# Patient Record
Sex: Female | Born: 1965 | Race: White | Hispanic: No | Marital: Single | Smoking: Current every day smoker
Health system: Southern US, Community
[De-identification: ages and names within clinical notes are randomized; demographics above are authoritative.]

## PROBLEM LIST (undated history)

## (undated) DIAGNOSIS — F419 Anxiety disorder, unspecified: Secondary | ICD-10-CM

## (undated) HISTORY — PX: OTHER SURGICAL HISTORY: SHX169

---

## 2014-06-27 ENCOUNTER — Encounter (HOSPITAL_BASED_OUTPATIENT_CLINIC_OR_DEPARTMENT_OTHER): Payer: Self-pay | Admitting: Emergency Medicine

## 2014-06-27 ENCOUNTER — Emergency Department (HOSPITAL_BASED_OUTPATIENT_CLINIC_OR_DEPARTMENT_OTHER)
Admission: EM | Admit: 2014-06-27 | Discharge: 2014-06-27 | Disposition: A | Discharge status: Home / Self Care | Payer: BLUE CROSS/BLUE SHIELD | Attending: Emergency Medicine | Admitting: Emergency Medicine

## 2014-06-27 ENCOUNTER — Emergency Department (HOSPITAL_BASED_OUTPATIENT_CLINIC_OR_DEPARTMENT_OTHER): Payer: BLUE CROSS/BLUE SHIELD

## 2014-06-27 DIAGNOSIS — Z720 Tobacco use: Secondary | ICD-10-CM | POA: Insufficient documentation

## 2014-06-27 DIAGNOSIS — R079 Chest pain, unspecified: Secondary | ICD-10-CM | POA: Diagnosis not present

## 2014-06-27 DIAGNOSIS — R51 Headache: Secondary | ICD-10-CM | POA: Diagnosis not present

## 2014-06-27 LAB — BASIC METABOLIC PANEL
Anion gap: 4 — ABNORMAL LOW (ref 5–15)
BUN: 14 mg/dL (ref 6–23)
CHLORIDE: 106 meq/L (ref 96–112)
CO2: 27 mmol/L (ref 19–32)
Calcium: 8.8 mg/dL (ref 8.4–10.5)
Creatinine, Ser: 0.82 mg/dL (ref 0.50–1.10)
GFR, EST NON AFRICAN AMERICAN: 83 mL/min — AB (ref >90)
Glucose, Bld: 100 mg/dL — ABNORMAL HIGH (ref 70–99)
POTASSIUM: 3.5 mmol/L (ref 3.5–5.1)
SODIUM: 137 mmol/L (ref 135–145)

## 2014-06-27 LAB — CBC WITH DIFFERENTIAL/PLATELET
BASOS ABS: 0.1 10*3/uL (ref 0.0–0.1)
Basophils Relative: 1 % (ref 0–1)
Eosinophils Absolute: 0.2 10*3/uL (ref 0.0–0.7)
Eosinophils Relative: 2 % (ref 0–5)
HCT: 38.5 % (ref 36.0–46.0)
HEMOGLOBIN: 13.1 g/dL (ref 12.0–15.0)
LYMPHS PCT: 28 % (ref 12–46)
Lymphs Abs: 3 10*3/uL (ref 0.7–4.0)
MCH: 31.3 pg (ref 26.0–34.0)
MCHC: 34 g/dL (ref 30.0–36.0)
MCV: 92.1 fL (ref 78.0–100.0)
MONOS PCT: 6 % (ref 3–12)
Monocytes Absolute: 0.6 10*3/uL (ref 0.1–1.0)
Neutro Abs: 6.9 10*3/uL (ref 1.7–7.7)
Neutrophils Relative %: 63 % (ref 43–77)
Platelets: 227 10*3/uL (ref 150–400)
RBC: 4.18 MIL/uL (ref 3.87–5.11)
RDW: 13.2 % (ref 11.5–15.5)
WBC: 10.7 10*3/uL — ABNORMAL HIGH (ref 4.0–10.5)

## 2014-06-27 LAB — TROPONIN I: Troponin I: <0.03 ng/mL (ref <0.031)

## 2014-06-27 NOTE — ED Provider Notes (Signed)
CSN: [id number]     Arrival date & time 06/27/14  1424 History   This chart was scribed for Vanetta MuldersScott Brenlynn Fake, MD by Evon Slackerrance Branch, ED Scribe. This patient was seen in room MH11/MH11 and the patient's care was started at 3:08 PM.      Chief Complaint  Patient presents with  . Chest Pain   Patient is a 49 y.o. female presenting with chest pain. The history is provided by the patient. No language interpreter was used.  Chest Pain Pain location:  L chest Pain quality: aching, dull and sharp   Pain radiates to:  Does not radiate Pain radiates to the back: no   Pain severity:  Mild Onset quality:  Sudden Duration:  1 day Timing:  Intermittent Chronicity:  New Context: at rest   Relieved by:  None tried Worsened by:  Nothing tried Ineffective treatments:  None tried Associated symptoms: headache   Associated symptoms: no abdominal pain, no back pain, no cough, no fever, no nausea, no shortness of breath and not vomiting    HPI Comments: Alexa Delacruz is a 49 y.o. female who presents to the Emergency Department complaining of sharp dull aching intermittent left sided chest pain near her left breast onset 1 day prior. Pt states each episode last for about 1 minute at a time. Pt states that the pain is non radiating. Denies SOB, nausea or vomiting. Denies HX of cardiac disease. Pt states her father has a HX of cardiac disease. Pt does also report having HA daily.      PCP Dr. Wylene SimmerPollard   History reviewed. No pertinent past medical history. History reviewed. No pertinent past surgical history. No family history on file. History  Substance Use Topics  . Smoking status: Current Every Day Smoker  . Smokeless tobacco: Not on file  . Alcohol Use: Not on file   OB History    No data available     Review of Systems  Constitutional: Negative for fever and chills.  HENT: Negative for rhinorrhea and sore throat.   Eyes: Negative for visual disturbance.  Respiratory: Negative for cough  and shortness of breath.   Cardiovascular: Positive for chest pain. Negative for leg swelling.  Gastrointestinal: Negative for nausea, vomiting, abdominal pain and diarrhea.  Genitourinary: Negative for dysuria.  Musculoskeletal: Negative for back pain and neck pain.  Skin: Negative for rash.  Neurological: Positive for headaches.  Psychiatric/Behavioral: Negative for confusion.  All other systems reviewed and are negative.    Allergies  Percocet  Home Medications   Prior to Admission medications   Not on File   BP 105/41 mmHg  Pulse 62  Temp(Src) 98.6 F (37 C) (Oral)  Resp 16  SpO2 99%  LMP 11/25/2013   Physical Exam  Constitutional: She is oriented to person, place, and time. She appears well-developed and well-nourished. No distress.  HENT:  Head: Normocephalic and atraumatic.  Eyes: Conjunctivae and EOM are normal. Pupils are equal, round, and reactive to light.  Neck: Neck supple. No tracheal deviation present.  Cardiovascular: Normal rate and regular rhythm.   Pulmonary/Chest: Effort normal and breath sounds normal. No respiratory distress. She has no wheezes. She has no rales.  Abdominal: Soft. Bowel sounds are normal. There is no tenderness.  Musculoskeletal: Normal range of motion. She exhibits no edema.  Neurological: She is alert and oriented to person, place, and time. No cranial nerve deficit.  Skin: Skin is warm and dry.  Psychiatric: She has a normal mood and affect.  Her behavior is normal.  Nursing note and vitals reviewed.   ED Course  Procedures (including critical care time) DIAGNOSTIC STUDIES: Oxygen Saturation is 98% on RA, normal by my interpretation.    COORDINATION OF CARE: 3:31 PM-Discussed treatment plan with pt at bedside and pt agreed to plan.     Labs Review Labs Reviewed  CBC WITH DIFFERENTIAL - Abnormal; Notable for the following:    WBC 10.7 (*)    All other components within normal limits  BASIC METABOLIC PANEL - Abnormal;  Notable for the following:    Glucose, Bld 100 (*)    GFR calc non Af Amer 83 (*)    Anion gap 4 (*)    All other components within normal limits  TROPONIN I   Results for orders placed or performed during the hospital encounter of 06/27/14  CBC with Differential  Result Value Ref Range   WBC 10.7 (H) 4.0 - 10.5 K/uL   RBC 4.18 3.87 - 5.11 MIL/uL   Hemoglobin 13.1 12.0 - 15.0 g/dL   HCT 16.1 09.6 - 04.5 %   MCV 92.1 78.0 - 100.0 fL   MCH 31.3 26.0 - 34.0 pg   MCHC 34.0 30.0 - 36.0 g/dL   RDW 40.9 81.1 - 91.4 %   Platelets 227 150 - 400 K/uL   Neutrophils Relative % 63 43 - 77 %   Neutro Abs 6.9 1.7 - 7.7 K/uL   Lymphocytes Relative 28 12 - 46 %   Lymphs Abs 3.0 0.7 - 4.0 K/uL   Monocytes Relative 6 3 - 12 %   Monocytes Absolute 0.6 0.1 - 1.0 K/uL   Eosinophils Relative 2 0 - 5 %   Eosinophils Absolute 0.2 0.0 - 0.7 K/uL   Basophils Relative 1 0 - 1 %   Basophils Absolute 0.1 0.0 - 0.1 K/uL  Basic metabolic panel  Result Value Ref Range   Sodium 137 135 - 145 mmol/L   Potassium 3.5 3.5 - 5.1 mmol/L   Chloride 106 96 - 112 mEq/L   CO2 27 19 - 32 mmol/L   Glucose, Bld 100 (H) 70 - 99 mg/dL   BUN 14 6 - 23 mg/dL   Creatinine, Ser 7.82 0.50 - 1.10 mg/dL   Calcium 8.8 8.4 - 95.6 mg/dL   GFR calc non Af Amer 83 (L) >90 mL/min   GFR calc Af Amer >90 >90 mL/min   Anion gap 4 (L) 5 - 15  Troponin I  Result Value Ref Range   Troponin I <0.03 <0.031 ng/mL     Imaging Review Dg Chest 2 View  06/27/2014   CLINICAL DATA:  Left chest pain since yesterday.  Smoker with cough.  EXAM: CHEST  2 VIEW  COMPARISON:  None.  FINDINGS: Heart and mediastinal contours are within normal limits. No focal opacities or effusions. No acute bony abnormality.  IMPRESSION: No active cardiopulmonary disease.   Electronically Signed   By: Charlett Nose M.D.   On: 06/27/2014 15:05     EKG Interpretation   Date/Time:  Saturday June 27 2014 14:28:40 EST Ventricular Rate:  84 PR Interval:  142 QRS  Duration: 100 QT Interval:  382 QTC Calculation: 451 R Axis:   78 Text Interpretation:  Normal sinus rhythm with sinus arrhythmia Right  atrial enlargement ST \\T \ T wave abnormality, consider inferior ischemia  Abnormal ECG No old tracing to compare Confirmed by WARD,  DO, KRISTEN  ([id number]) on 06/27/2014 2:32:21 PM Also confirmed by Deretha Emory  MD, Lorin Picket  [phone])  on 06/27/2014 3:09:49 PM      MDM   Final diagnoses:  Chest pain, unspecified chest pain type   Workup for the chest pain without any acute findings. EKG without acute cardiac changes troponin was negative. Chest x-ray negative for pneumonia pneumothorax or pulmonary edema. Patient's chest pain has been intermittent is not been lasting for a long period of time. Never had chest pain for 15 or 20 minutes.   I personally performed the services described in this documentation, which was scribed in my presence. The recorded information has been reviewed and is accurate.       Vanetta Mulders, MD 06/28/14 [phone]

## 2014-06-27 NOTE — Discharge Instructions (Signed)
Workup for the chest pain was negative. Return for any new or worse symptoms or chest pain lasting 15 or 20 minutes or longer. Would recommend follow-up with your regular doctor in the next few days for EKG reviewed.

## 2014-06-27 NOTE — ED Notes (Signed)
D/c home with ride- no new rx given 

## 2014-06-27 NOTE — ED Notes (Signed)
MD at bedside. 

## 2014-06-27 NOTE — ED Notes (Signed)
PT presents  To ED with complaints of pain under her left breast intermittent since yesterday.

## 2015-03-19 ENCOUNTER — Encounter (HOSPITAL_BASED_OUTPATIENT_CLINIC_OR_DEPARTMENT_OTHER): Payer: Self-pay

## 2015-03-19 ENCOUNTER — Emergency Department (HOSPITAL_BASED_OUTPATIENT_CLINIC_OR_DEPARTMENT_OTHER)
Admission: EM | Admit: 2015-03-19 | Discharge: 2015-03-19 | Disposition: A | Discharge status: Home / Self Care | Payer: BLUE CROSS/BLUE SHIELD | Attending: Emergency Medicine | Admitting: Emergency Medicine

## 2015-03-19 DIAGNOSIS — K029 Dental caries, unspecified: Secondary | ICD-10-CM | POA: Insufficient documentation

## 2015-03-19 DIAGNOSIS — K0889 Other specified disorders of teeth and supporting structures: Secondary | ICD-10-CM | POA: Insufficient documentation

## 2015-03-19 DIAGNOSIS — Z720 Tobacco use: Secondary | ICD-10-CM | POA: Diagnosis not present

## 2015-03-19 MED ORDER — HYDROCODONE-ACETAMINOPHEN 5-325 MG PO TABS: 1.0000 | ORAL_TABLET | Freq: Four times a day (QID) | ORAL | Status: AC | PRN

## 2015-03-19 MED ORDER — PENICILLIN V POTASSIUM 500 MG PO TABS: 500.0000 mg | ORAL_TABLET | Freq: Three times a day (TID) | ORAL | Status: AC

## 2015-03-19 NOTE — Discharge Instructions (Signed)
Penicillin as prescribed.  Hydrocodone as prescribed as needed for pain.  Follow-up with your dentist on Monday, and return to the ER if symptoms significantly worsen or change.

## 2015-03-19 NOTE — ED Provider Notes (Signed)
CSN: [id number]     Arrival date & time 03/19/15  8295 History   First MD Initiated Contact with Patient 03/19/15 [phone]     Chief Complaint  Patient presents with  . Dental Pain     (Consider location/radiation/quality/duration/timing/severity/associated sxs/prior Treatment) Patient is a 49 y.o. female presenting with tooth pain. The history is provided by the patient.  Dental Pain Location:  Lower Lower teeth location:  18/LL 2nd molar Quality:  Throbbing Severity:  Severe Onset quality:  Sudden Duration:  2 days Timing:  Constant Progression:  Worsening Chronicity:  New Relieved by:  Nothing Worsened by:  Nothing tried Ineffective treatments:  None tried   History reviewed. No pertinent past medical history. Past Surgical History  Procedure Laterality Date  . Cesearean     History reviewed. No pertinent family history. Social History  Substance Use Topics  . Smoking status: Current Every Day Smoker  . Smokeless tobacco: None  . Alcohol Use: No   OB History    No data available     Review of Systems  All other systems reviewed and are negative.     Allergies  Percocet  Home Medications   Prior to Admission medications   Medication Sig Start Date End Date Taking? Authorizing Provider  ESTROGENS CONJ SYNTHETIC A PO Take by mouth.   Yes Historical Provider, MD   BP 132/76 mmHg  Pulse 70  Temp(Src) 98.5 F (36.9 C) (Oral)  Resp 16  Ht  (1.778 m)  Wt 147 lb (66.679 kg)  BMI 21.09 kg/m2  SpO2 99%  LMP 11/25/2013 Physical Exam  Constitutional: She appears well-developed and well-nourished. No distress.  HENT:  Head: Normocephalic and atraumatic.  Mouth/Throat: Oropharynx is clear and moist.  There are several decayed teeth present. The left lower molars are heavily decayed with fillings in place. There is tenderness to the left lower jaw, however no significant swelling, adenopathy, or crepitus.  Neck: Normal range of motion. Neck supple.   Pulmonary/Chest: No stridor.  Lymphadenopathy:    She has no cervical adenopathy.  Skin: Skin is warm and dry. She is not diaphoretic.  Nursing note and vitals reviewed.   ED Course  Procedures (including critical care time) Labs Review Labs Reviewed - No data to display  Imaging Review No results found. I have personally reviewed and evaluated these images and lab results as part of my medical decision-making.   EKG Interpretation None      MDM   Final diagnoses:  None    We'll treat with antibiotics, pain meds, and follow-up with dentistry.    Geoffery Lyons, MD 03/19/15 [phone]

## 2015-03-19 NOTE — ED Notes (Signed)
Pt reports left lower dental pain x2 days - reports caries, broken teeth. States she has a Education officer, community but they can not see her until next week. Pt reports Naproxen (2) this morning but reports minimal relief. Pain 9/10 at this time.

## 2015-03-20 ENCOUNTER — Emergency Department (HOSPITAL_BASED_OUTPATIENT_CLINIC_OR_DEPARTMENT_OTHER)
Admission: EM | Admit: 2015-03-20 | Discharge: 2015-03-20 | Disposition: A | Discharge status: Home / Self Care | Payer: BLUE CROSS/BLUE SHIELD | Attending: Emergency Medicine | Admitting: Emergency Medicine

## 2015-03-20 ENCOUNTER — Encounter (HOSPITAL_BASED_OUTPATIENT_CLINIC_OR_DEPARTMENT_OTHER): Payer: Self-pay | Admitting: Emergency Medicine

## 2015-03-20 DIAGNOSIS — R112 Nausea with vomiting, unspecified: Secondary | ICD-10-CM | POA: Diagnosis present

## 2015-03-20 DIAGNOSIS — Z720 Tobacco use: Secondary | ICD-10-CM | POA: Insufficient documentation

## 2015-03-20 MED ORDER — METOCLOPRAMIDE HCL 10 MG PO TABS: 10.0000 mg | ORAL_TABLET | Freq: Four times a day (QID) | ORAL | Status: AC | PRN

## 2015-03-20 MED ORDER — METOCLOPRAMIDE HCL 5 MG/ML IJ SOLN
10.0000 mg | Freq: Once | INTRAMUSCULAR | Status: AC
  Administered 2015-03-20: 10 mg via INTRAVENOUS
  Filled 2015-03-20: qty 2

## 2015-03-20 MED ORDER — SODIUM CHLORIDE 0.9 % IV BOLUS (SEPSIS)
1000.0000 mL | Freq: Once | INTRAVENOUS | Status: AC
  Administered 2015-03-20: 1000 mL via INTRAVENOUS

## 2015-03-20 MED ORDER — KETOROLAC TROMETHAMINE 30 MG/ML IJ SOLN
30.0000 mg | Freq: Once | INTRAMUSCULAR | Status: AC
  Administered 2015-03-20: 30 mg via INTRAVENOUS
  Filled 2015-03-20: qty 1

## 2015-03-20 MED ORDER — ONDANSETRON HCL 4 MG/2ML IJ SOLN
4.0000 mg | Freq: Once | INTRAMUSCULAR | Status: AC
  Administered 2015-03-20: 4 mg via INTRAVENOUS
  Filled 2015-03-20: qty 2

## 2015-03-20 MED ORDER — DIPHENHYDRAMINE HCL 50 MG/ML IJ SOLN
25.0000 mg | Freq: Once | INTRAMUSCULAR | Status: AC
  Administered 2015-03-20: 25 mg via INTRAVENOUS
  Filled 2015-03-20: qty 1

## 2015-03-20 NOTE — Discharge Instructions (Signed)

## 2015-03-20 NOTE — ED Provider Notes (Signed)
CSN: [id number]     Arrival date & time 03/20/15  5621 History   First MD Initiated Contact with Patient 03/20/15 [phone]     Chief Complaint  Patient presents with  . Emesis     (Consider location/radiation/quality/duration/timing/severity/associated sxs/prior Treatment) Patient is a 49 y.o. female presenting with vomiting.  Emesis Severity:  Moderate Duration:  1 day Timing:  Intermittent Quality:  Stomach contents Progression:  Unchanged Relieved by:  Nothing Worsened by:  Nothing tried Ineffective treatments:  None tried Associated symptoms: no abdominal pain and no diarrhea   Associated symptoms comment:  Dental pain, recently started on penicillin, hydrocodone   History reviewed. No pertinent past medical history. Past Surgical History  Procedure Laterality Date  . Cesearean     History reviewed. No pertinent family history. Social History  Substance Use Topics  . Smoking status: Current Every Day Smoker  . Smokeless tobacco: None  . Alcohol Use: No   OB History    No data available     Review of Systems  Gastrointestinal: Positive for vomiting. Negative for abdominal pain and diarrhea.  All other systems reviewed and are negative.     Allergies  Percocet  Home Medications   Prior to Admission medications   Medication Sig Start Date End Date Taking? Authorizing Provider  ESTROGENS CONJ SYNTHETIC A PO Take by mouth.    Historical Provider, MD  HYDROcodone-acetaminophen (NORCO) 5-325 MG tablet Take 1-2 tablets by mouth every 6 (six) hours as needed. 03/19/15   Geoffery Lyons, MD  metoCLOPramide (REGLAN) 10 MG tablet Take 1 tablet (10 mg total) by mouth every 6 (six) hours as needed for nausea (nausea/headache). 03/20/15   Mirian Mo, MD  penicillin v potassium (VEETID) 500 MG tablet Take 1 tablet (500 mg total) by mouth 3 (three) times daily. 03/19/15   Geoffery Lyons, MD   BP 124/60 mmHg  Pulse 88  Temp(Src) 97.8 F (36.6 C) (Oral)  Resp 18  Ht   (1.778 m)  Wt 140 lb (63.504 kg)  BMI 20.09 kg/m2  SpO2 100%  LMP 11/25/2013 Physical Exam  Constitutional: She is oriented to person, place, and time. She appears well-developed and well-nourished.  HENT:  Head: Normocephalic and atraumatic.  Right Ear: External ear normal.  Left Ear: External ear normal.  Eyes: Conjunctivae and EOM are normal. Pupils are equal, round, and reactive to light.  Neck: Normal range of motion. Neck supple.  Cardiovascular: Normal rate, regular rhythm, normal heart sounds and intact distal pulses.   Pulmonary/Chest: Effort normal and breath sounds normal.  Abdominal: Soft. Bowel sounds are normal. There is no tenderness.  Musculoskeletal: Normal range of motion.  Neurological: She is alert and oriented to person, place, and time.  Skin: Skin is warm and dry.  Vitals reviewed.   ED Course  Procedures (including critical care time) Labs Review Labs Reviewed - No data to display  Imaging Review No results found. I have personally reviewed and evaluated these images and lab results as part of my medical decision-making.   EKG Interpretation None      MDM   Final diagnoses:  Non-intractable vomiting with nausea, vomiting of unspecified type    49 y.o. female with pertinent PMH of recent visit for dental pain, px for hydrocodone and pcn presents with vomiting.  No abd pain.  Exam benign.  Pt given zofran, reglan, benadryl, NS bolus and had relief of symptoms.  DC home in stable condition with prescription for reglan.  I have  reviewed all laboratory and imaging studies if ordered as above  1. Non-intractable vomiting with nausea, vomiting of unspecified type        Mirian Mo, MD 03/20/15 1234

## 2015-03-20 NOTE — ED Notes (Signed)
Pt seen yesterday for dental pain, started pcn and hydrocodone last night and started vomiting

## 2015-09-27 ENCOUNTER — Encounter (HOSPITAL_BASED_OUTPATIENT_CLINIC_OR_DEPARTMENT_OTHER): Payer: Self-pay

## 2015-09-27 ENCOUNTER — Emergency Department (HOSPITAL_BASED_OUTPATIENT_CLINIC_OR_DEPARTMENT_OTHER)
Admission: EM | Admit: 2015-09-27 | Discharge: 2015-09-27 | Disposition: A | Discharge status: Home / Self Care | Payer: BLUE CROSS/BLUE SHIELD | Attending: Dermatology | Admitting: Dermatology

## 2015-09-27 DIAGNOSIS — Z5321 Procedure and treatment not carried out due to patient leaving prior to being seen by health care provider: Secondary | ICD-10-CM | POA: Insufficient documentation

## 2015-09-27 DIAGNOSIS — R220 Localized swelling, mass and lump, head: Secondary | ICD-10-CM | POA: Diagnosis present

## 2015-09-27 NOTE — ED Notes (Signed)
No answer when called for tx area 

## 2015-09-27 NOTE — ED Notes (Signed)
Pt reports L jaw swelling over the course of the day, large cavity in back molar noted.

## 2021-07-01 ENCOUNTER — Emergency Department (HOSPITAL_BASED_OUTPATIENT_CLINIC_OR_DEPARTMENT_OTHER)
Admission: EM | Admit: 2021-07-01 | Discharge: 2021-07-01 | Disposition: A | Discharge status: Home / Self Care | Payer: BC Managed Care – PPO | Attending: Emergency Medicine | Admitting: Emergency Medicine

## 2021-07-01 ENCOUNTER — Emergency Department (HOSPITAL_BASED_OUTPATIENT_CLINIC_OR_DEPARTMENT_OTHER): Payer: BC Managed Care – PPO

## 2021-07-01 ENCOUNTER — Encounter (HOSPITAL_BASED_OUTPATIENT_CLINIC_OR_DEPARTMENT_OTHER): Payer: Self-pay | Admitting: *Deleted

## 2021-07-01 ENCOUNTER — Other Ambulatory Visit: Payer: Self-pay

## 2021-07-01 DIAGNOSIS — G43C0 Periodic headache syndromes in child or adult, not intractable: Secondary | ICD-10-CM | POA: Insufficient documentation

## 2021-07-01 HISTORY — DX: Anxiety disorder, unspecified: F41.9

## 2021-07-01 LAB — BASIC METABOLIC PANEL
Anion gap: 8 (ref 5–15)
BUN: 9 mg/dL (ref 6–20)
CO2: 26 mmol/L (ref 22–32)
Calcium: 9.3 mg/dL (ref 8.9–10.3)
Chloride: 103 mmol/L (ref 98–111)
Creatinine, Ser: 0.65 mg/dL (ref 0.44–1.00)
GFR, Estimated: >60 mL/min (ref >60)
Glucose, Bld: 117 mg/dL — ABNORMAL HIGH (ref 70–99)
Potassium: 4.5 mmol/L (ref 3.5–5.1)
Sodium: 137 mmol/L (ref 135–145)

## 2021-07-01 LAB — CBC
HCT: 41 % (ref 36.0–46.0)
Hemoglobin: 14.3 g/dL (ref 12.0–15.0)
MCH: 31.2 pg (ref 26.0–34.0)
MCHC: 34.9 g/dL (ref 30.0–36.0)
MCV: 89.5 fL (ref 80.0–100.0)
Platelets: 311 10*3/uL (ref 150–400)
RBC: 4.58 MIL/uL (ref 3.87–5.11)
RDW: 12.8 % (ref 11.5–15.5)
WBC: 11.2 10*3/uL — ABNORMAL HIGH (ref 4.0–10.5)
nRBC: 0 % (ref 0.0–0.2)

## 2021-07-01 MED ORDER — ONDANSETRON HCL 4 MG/2ML IJ SOLN
4.0000 mg | Freq: Once | INTRAMUSCULAR | Status: AC
  Administered 2021-07-01: 4 mg via INTRAVENOUS
  Filled 2021-07-01: qty 2

## 2021-07-01 MED ORDER — KETOROLAC TROMETHAMINE 30 MG/ML IJ SOLN
15.0000 mg | Freq: Once | INTRAMUSCULAR | Status: AC
Start: 2021-07-01 — End: 2021-07-01
  Administered 2021-07-01: 15 mg via INTRAVENOUS
  Filled 2021-07-01: qty 1

## 2021-07-01 MED ORDER — SODIUM CHLORIDE 0.9 % IV BOLUS
1000.0000 mL | Freq: Once | INTRAVENOUS | Status: AC
  Administered 2021-07-01: 1000 mL via INTRAVENOUS

## 2021-07-01 MED ORDER — KETOROLAC TROMETHAMINE 60 MG/2ML IM SOLN
30.0000 mg | Freq: Once | INTRAMUSCULAR | Status: AC
  Administered 2021-07-01: 30 mg via INTRAMUSCULAR
  Filled 2021-07-01: qty 2

## 2021-07-01 MED ORDER — DIPHENHYDRAMINE HCL 50 MG/ML IJ SOLN
12.5000 mg | Freq: Once | INTRAMUSCULAR | Status: AC
  Administered 2021-07-01: 12.5 mg via INTRAVENOUS
  Filled 2021-07-01: qty 1

## 2021-07-01 NOTE — ED Notes (Signed)
Hx of migraines. Took 2 ibuprofen around 1320. States has vomited appx 8 times since this morning. States woke up with headache, vomited then started having bloody nose, lasting a short period. Light sensitivity, nausea, denies abdominal pain.

## 2021-07-01 NOTE — Discharge Instructions (Addendum)
Please only use Tylenol for your headache throughout the rest of the day.  You were given a medication similar to ibuprofen in the department.  Follow-up with a primary care provider if you continue to have frequent migraines and return to the emergency department for any worsening symptoms.

## 2021-07-01 NOTE — ED Provider Notes (Signed)
[facility] EMERGENCY DEPARTMENT Provider Note   CSN: [id number] Arrival date & time: 07/01/21  1336     History  No chief complaint on file.   Alexa Delacruz is a 56 y.o. female with a past medical history of migraine headaches presenting today with a headache that began at 3 AM this morning.  Patient reports that this headache came on suddenly, she began to have a nosebleed and intense nausea.  Not on blood thinners.  Reports that this is much more severe than her normal headaches.  Localizes the pain to the superior forehead.  Endorses some photophobia, no phonophobia.  No visual disturbances.  No problems with her balance or word finding.  Tried to take ibuprofen at home however this did not help her.  No history of high blood pressure and nosebleed was controlled with pressure.    Home Medications Prior to Admission medications   Medication Sig Start Date End Date Taking? Authorizing Provider  citalopram (CELEXA) 20 MG tablet Take 1 tablet by mouth daily. 06/23/21  Yes [provider]  ESTROGENS CONJ SYNTHETIC A PO Take by mouth.    [provider]  HYDROcodone-acetaminophen (NORCO) 5-325 MG tablet Take 1-2 tablets by mouth every 6 (six) hours as needed. 03/19/15   Geoffery Lyons, MD  metoCLOPramide (REGLAN) 10 MG tablet Take 1 tablet (10 mg total) by mouth every 6 (six) hours as needed for nausea (nausea/headache). 03/20/15   Mirian Mo, MD  Multiple Vitamin (MULTIVITAMIN) tablet Take 1 tablet by mouth daily.    [provider]  penicillin v potassium (VEETID) 500 MG tablet Take 1 tablet (500 mg total) by mouth 3 (three) times daily. 03/19/15   Geoffery Lyons, MD      Allergies    Penicillins and Percocet [oxycodone-acetaminophen]    Review of Systems   Review of Systems  Eyes:  Positive for photophobia. Negative for visual disturbance.  Gastrointestinal:  Positive for nausea and vomiting.  Musculoskeletal:  Negative for gait problem and  neck pain.  Neurological:  Positive for headaches. Negative for dizziness, syncope and weakness.  Psychiatric/Behavioral:  Negative for confusion.    Physical Exam Updated Vital Signs BP 122/75 (BP Location: Right Arm)    Pulse 75    Temp 98 F (36.7 C) (Oral)    Resp 18    Ht 5\' 10"  (1.778 m)    Wt 72.6 kg    LMP 11/25/2013    SpO2 98%    BMI 22.96 kg/m  Physical Exam Vitals and nursing note reviewed.  Constitutional:      General: She is not in acute distress.    Appearance: Normal appearance. She is not ill-appearing.  HENT:     Head: Normocephalic and atraumatic.  Eyes:     General: No scleral icterus.    Conjunctiva/sclera: Conjunctivae normal.     Pupils: Pupils are equal, round, and reactive to light.  Cardiovascular:     Rate and Rhythm: Normal rate and regular rhythm.  Pulmonary:     Effort: Pulmonary effort is normal. No respiratory distress.     Breath sounds: No wheezing.  Skin:    General: Skin is warm and dry.     Findings: No rash.  Neurological:     General: No focal deficit present.     Mental Status: She is alert.     Cranial Nerves: No cranial nerve deficit (2 through 12 grossly intact).     Comments: Finger-nose and heel shin testing normal.  EOMs intact.  Cranial nerves II through XII tested and intact  Psychiatric:        Mood and Affect: Mood normal.        Behavior: Behavior normal.    ED Results / Procedures / Treatments   Labs (all labs ordered are listed, but only abnormal results are displayed) Labs Reviewed  CBC - Abnormal; Notable for the following components:      Result Value   WBC 11.2 (*)    All other components within normal limits  BASIC METABOLIC PANEL - Abnormal; Notable for the following components:   Glucose, Bld 117 (*)    All other components within normal limits  PREGNANCY, URINE    EKG None  Radiology CT Head Wo Contrast  Result Date: 07/01/2021 CLINICAL DATA:  Headache EXAM: CT HEAD WITHOUT CONTRAST TECHNIQUE:  Contiguous axial images were obtained from the base of the skull through the vertex without intravenous contrast. RADIATION DOSE REDUCTION: This exam was performed according to the departmental dose-optimization program which includes automated exposure control, adjustment of the mA and/or kV according to patient size and/or use of iterative reconstruction technique. COMPARISON:  None. FINDINGS: Brain: No evidence of acute infarction, hemorrhage, hydrocephalus, extra-axial collection or mass lesion/mass effect. Vascular: No hyperdense vessel or unexpected calcification. Skull: Normal. Negative for fracture or focal lesion. Sinuses/Orbits: No acute finding. Other: None. IMPRESSION: No acute intracranial process identified. Electronically Signed   By: Jannifer Hick M.D.   On: 07/01/2021 14:34    Procedures Procedures    Medications Ordered in ED Medications  ketorolac (TORADOL) 30 MG/ML injection 15 mg (has no administration in time range)  sodium chloride 0.9 % bolus 1,000 mL (has no administration in time range)  ondansetron (ZOFRAN) injection 4 mg (4 mg Intravenous Given 07/01/21 1413)  diphenhydrAMINE (BENADRYL) injection 12.5 mg (12.5 mg Intravenous Given 07/01/21 1415)    ED Course/ Medical Decision Making/ A&P                           Medical Decision Making Amount and/or Complexity of Data Reviewed Labs: ordered. Radiology: ordered.  Risk Prescription drug management.   56 year old female presenting for headache.  History of migraines however rated this much more severe.  Does not usually have nausea and vomiting with her migraines.  Her at home medications did not work.  Patient with many red flags, including her age, severity and sudden onset.  CT ordered for these reasons.  It was independently reviewed by me and I agree with the radiologist's read of no abnormalities.  No hemorrhage.  Patient was treated like a migraine, given Zofran, Toradol and Benadryl.  Reports that the  headache started at a 50 out of 10 and is now subsiding.  Remains neurologically intact.  Stable for discharge with an IM Toradol shot at this time with strict return precautions.  She will follow-up with a primary care provider if she continues to have severe headaches.   Final Clinical Impression(s) / ED Diagnoses Final diagnoses:  Periodic headache syndrome, not intractable    Rx / DC Orders Results and diagnoses were explained to the patient. Return precautions discussed in full. Patient had no additional questions and expressed complete understanding.   This chart was dictated using voice recognition software.  Despite best efforts to proofread,  errors can occur which can change the documentation meaning.    Woodroe Chen 07/01/21 1536    Terrilee Files, MD  07/01/21 1943 ° °

## 2021-07-01 NOTE — ED Notes (Signed)
Pt given ice chips with [place], [place] approval

## 2021-07-01 NOTE — ED Triage Notes (Signed)
Woke at 4am with headache and nose bleed. Vomiting and headache.

## 2023-10-06 IMAGING — CT CT HEAD W/O CM
3 series · 15 of 47 positions shown, 18 images · non-contrast
Comparison: None.

CLINICAL DATA: Headache



[Series 2: head wo · axial · 0.41mm/px · z∈[+1125,+1250]mm · 9 of 30 slices shown, 12 images]
[im 3/30  brain]
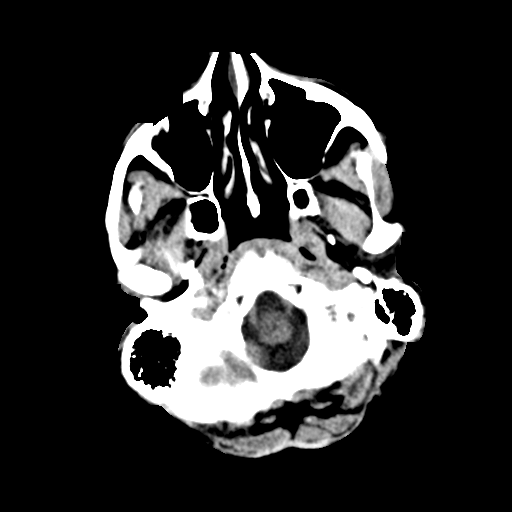
[im 3/30  bone]
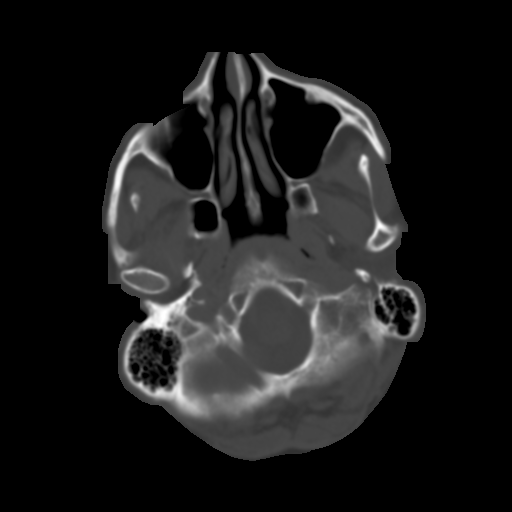
[im 6/30  brain]
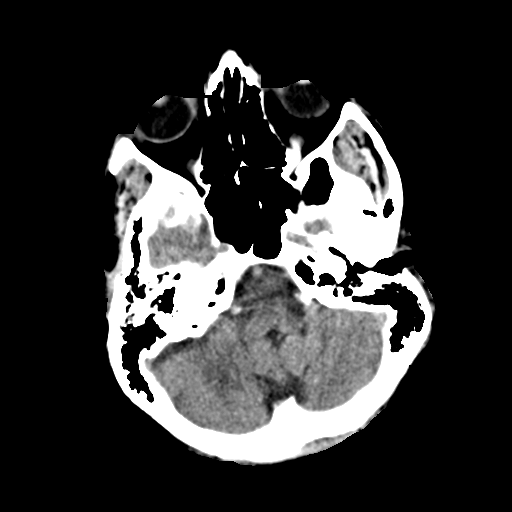
[im 9/30  brain]
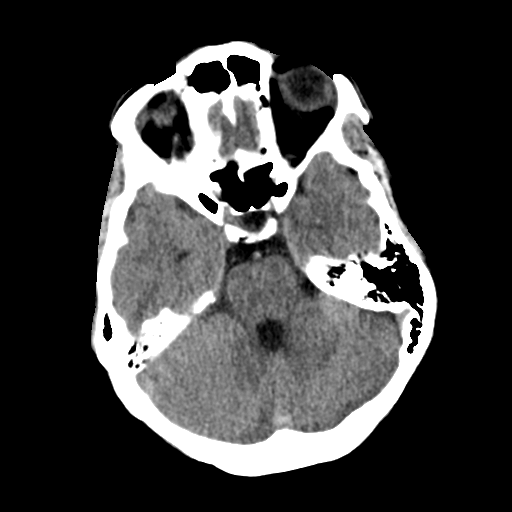
[im 12/30  brain]
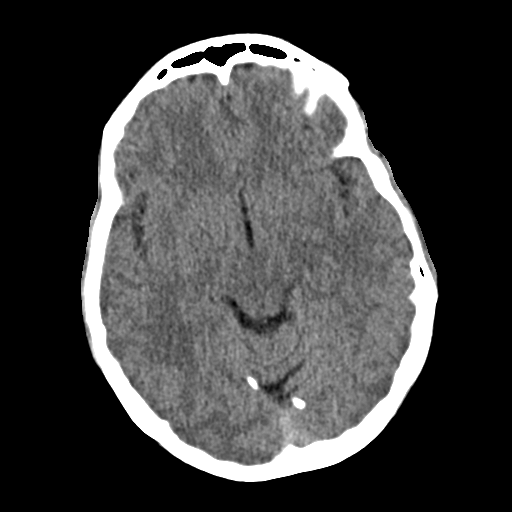
[im 16/30  brain]
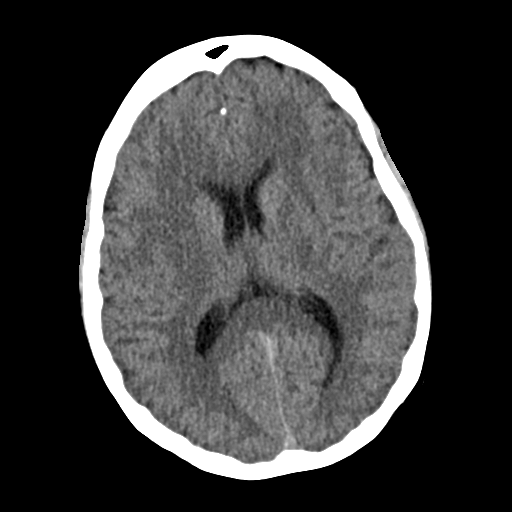
[im 16/30  bone]
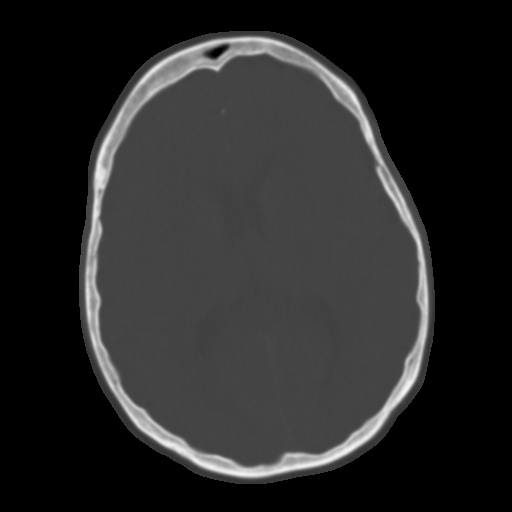
[im 19/30  brain]
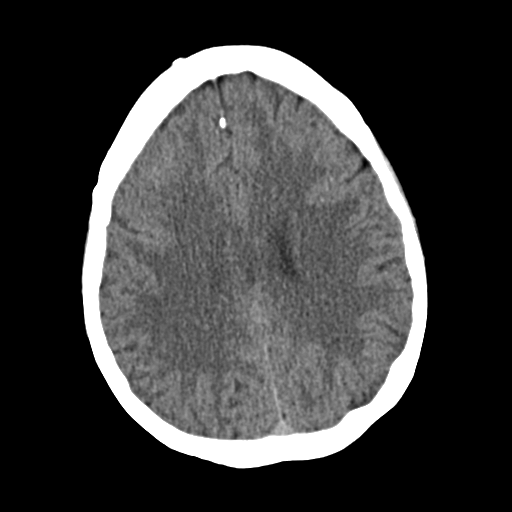
[im 22/30  brain]
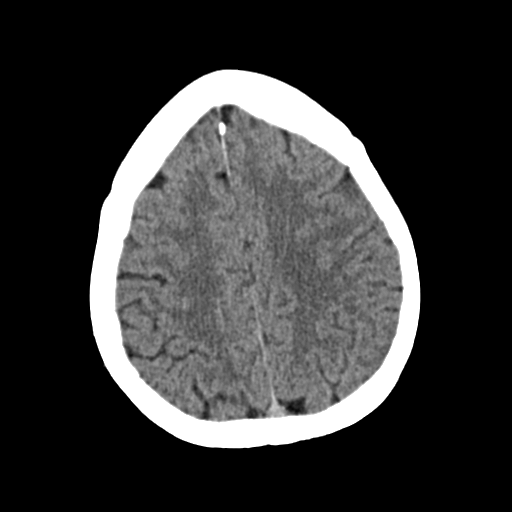
[im 25/30  brain]
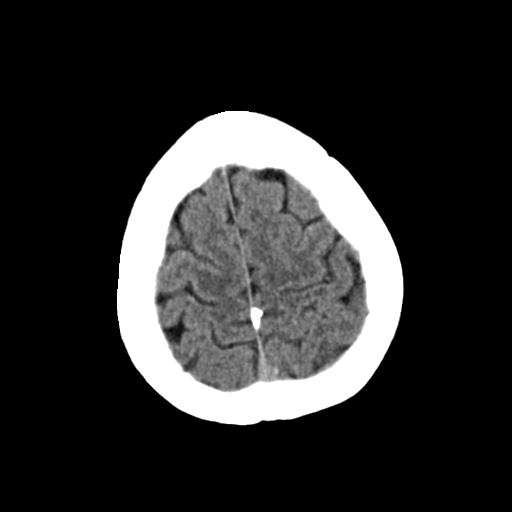
[im 28/30  brain]
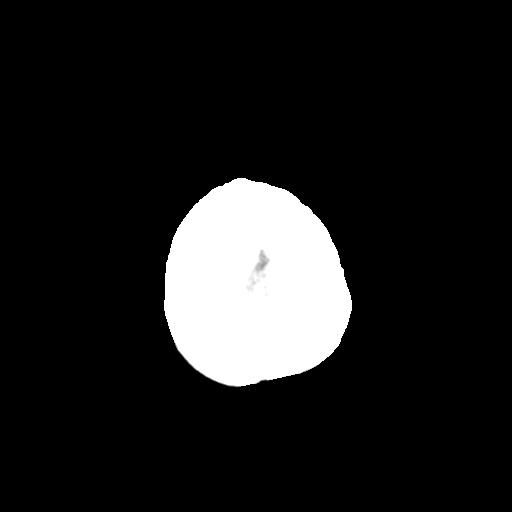
[im 28/30  bone]
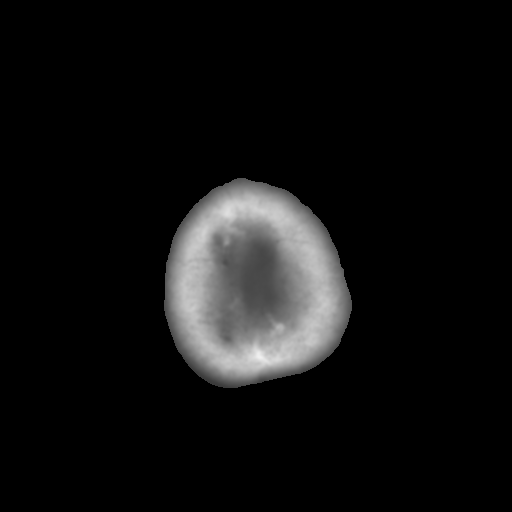

[Series 4: coronal soft · coronal · 0.30mm/px · 3 of 65 slices shown]
[im 22/65  brain]
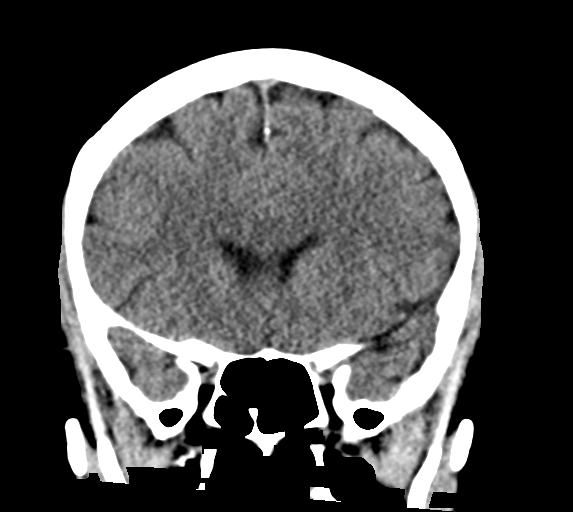
[im 29/65  brain]
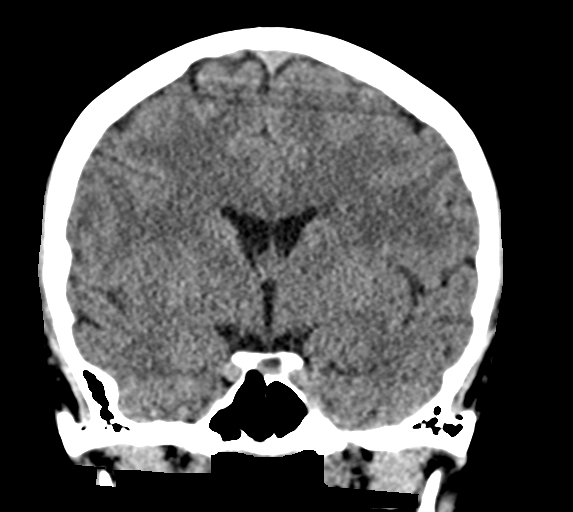
[im 36/65  brain]
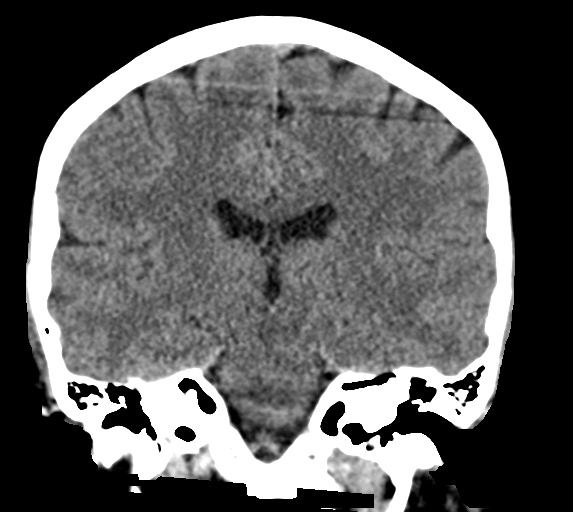

[Series 5: sag soft · sagittal · 0.31mm/px · 3 of 55 slices shown]
[im 19/55  brain]
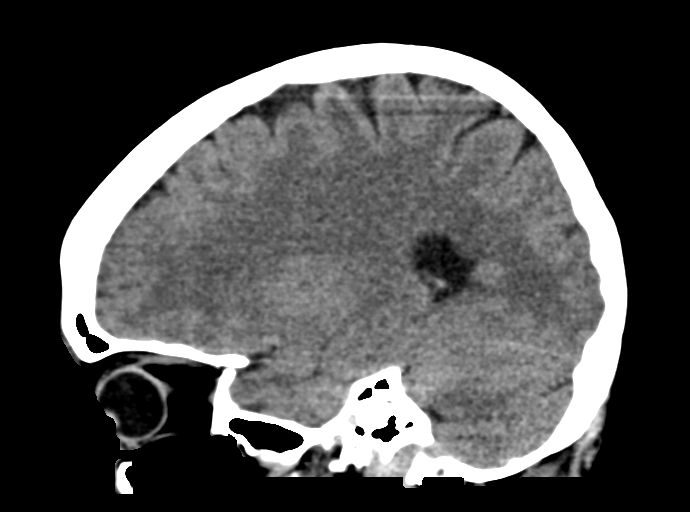
[im 28/55  brain]
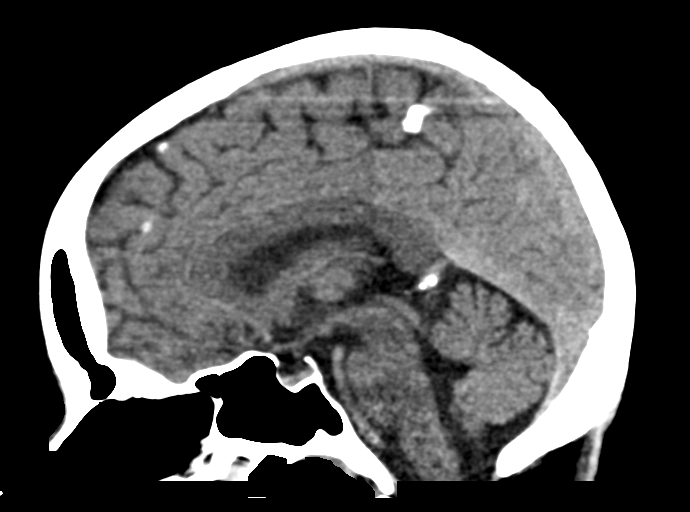
[im 37/55  brain]
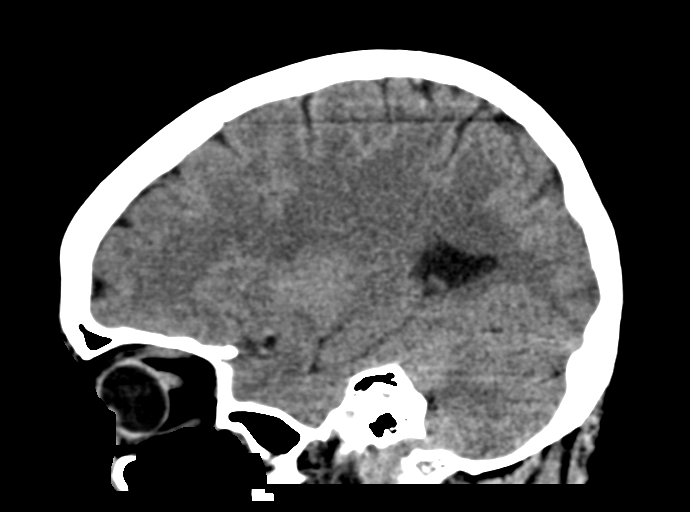

[15 of 47 positions shown; findings below may reference images not displayed]

FINDINGS: Brain: No evidence of acute infarction, hemorrhage, hydrocephalus,
extra-axial collection or mass lesion/mass effect.

Vascular: No hyperdense vessel or unexpected calcification.

Skull: Normal. Negative for fracture or focal lesion.

Sinuses/Orbits: No acute finding.

Other: None.
IMPRESSION: No acute intracranial process identified.
